# Patient Record
Sex: Female | Born: 1945 | Race: Black or African American | Hispanic: No | Smoking: Never smoker
Health system: Southern US, Community
[De-identification: ages and names within clinical notes are randomized; demographics above are authoritative.]

## PROBLEM LIST (undated history)

## (undated) DIAGNOSIS — I10 Essential (primary) hypertension: Secondary | ICD-10-CM

## (undated) DIAGNOSIS — E785 Hyperlipidemia, unspecified: Secondary | ICD-10-CM

## (undated) DIAGNOSIS — E119 Type 2 diabetes mellitus without complications: Secondary | ICD-10-CM

## (undated) DIAGNOSIS — I509 Heart failure, unspecified: Secondary | ICD-10-CM

## (undated) HISTORY — PX: APPENDECTOMY: SHX54

---

## 1982-05-02 HISTORY — PX: TUBAL LIGATION: SHX77

## 2021-03-11 ENCOUNTER — Emergency Department: Payer: Medicare Other

## 2021-03-11 ENCOUNTER — Other Ambulatory Visit: Payer: Self-pay

## 2021-03-11 ENCOUNTER — Emergency Department
Admission: EM | Admit: 2021-03-11 | Discharge: 2021-03-11 | Disposition: A | Discharge status: Home / Self Care | Payer: Medicare Other | Attending: Emergency Medicine | Admitting: Emergency Medicine

## 2021-03-11 ENCOUNTER — Encounter: Payer: Self-pay | Admitting: Emergency Medicine

## 2021-03-11 DIAGNOSIS — E785 Hyperlipidemia, unspecified: Secondary | ICD-10-CM | POA: Insufficient documentation

## 2021-03-11 DIAGNOSIS — I110 Hypertensive heart disease with heart failure: Secondary | ICD-10-CM | POA: Diagnosis not present

## 2021-03-11 DIAGNOSIS — I2510 Atherosclerotic heart disease of native coronary artery without angina pectoris: Secondary | ICD-10-CM | POA: Insufficient documentation

## 2021-03-11 DIAGNOSIS — Z20822 Contact with and (suspected) exposure to covid-19: Secondary | ICD-10-CM | POA: Diagnosis not present

## 2021-03-11 DIAGNOSIS — R0602 Shortness of breath: Secondary | ICD-10-CM | POA: Diagnosis present

## 2021-03-11 DIAGNOSIS — R062 Wheezing: Secondary | ICD-10-CM | POA: Insufficient documentation

## 2021-03-11 DIAGNOSIS — E1169 Type 2 diabetes mellitus with other specified complication: Secondary | ICD-10-CM | POA: Insufficient documentation

## 2021-03-11 DIAGNOSIS — I5030 Unspecified diastolic (congestive) heart failure: Secondary | ICD-10-CM | POA: Diagnosis not present

## 2021-03-11 HISTORY — DX: Essential (primary) hypertension: I10

## 2021-03-11 HISTORY — DX: Hyperlipidemia, unspecified: E78.5

## 2021-03-11 HISTORY — DX: Heart failure, unspecified: I50.9

## 2021-03-11 HISTORY — DX: Type 2 diabetes mellitus without complications: E11.9

## 2021-03-11 LAB — CBC
HCT: 31.9 % — ABNORMAL LOW (ref 36.0–46.0)
Hemoglobin: 10.1 g/dL — ABNORMAL LOW (ref 12.0–15.0)
MCH: 28.8 pg (ref 26.0–34.0)
MCHC: 31.7 g/dL (ref 30.0–36.0)
MCV: 90.9 fL (ref 80.0–100.0)
Platelets: 246 10*3/uL (ref 150–400)
RBC: 3.51 MIL/uL — ABNORMAL LOW (ref 3.87–5.11)
RDW: 14.8 % (ref 11.5–15.5)
WBC: 10.1 10*3/uL (ref 4.0–10.5)
nRBC: 0 % (ref 0.0–0.2)

## 2021-03-11 LAB — BASIC METABOLIC PANEL
Anion gap: 8 (ref 5–15)
BUN: 24 mg/dL — ABNORMAL HIGH (ref 8–23)
CO2: 27 mmol/L (ref 22–32)
Calcium: 9.1 mg/dL (ref 8.9–10.3)
Chloride: 104 mmol/L (ref 98–111)
Creatinine, Ser: 1.14 mg/dL — ABNORMAL HIGH (ref 0.44–1.00)
GFR, Estimated: 51 mL/min — ABNORMAL LOW (ref >60)
Glucose, Bld: 111 mg/dL — ABNORMAL HIGH (ref 70–99)
Potassium: 4.6 mmol/L (ref 3.5–5.1)
Sodium: 139 mmol/L (ref 135–145)

## 2021-03-11 LAB — BRAIN NATRIURETIC PEPTIDE: B Natriuretic Peptide: 101.5 pg/mL — ABNORMAL HIGH (ref 0.0–100.0)

## 2021-03-11 LAB — TROPONIN I (HIGH SENSITIVITY)
Troponin I (High Sensitivity): 34 ng/L — ABNORMAL HIGH (ref <18)
Troponin I (High Sensitivity): 34 ng/L — ABNORMAL HIGH (ref <18)

## 2021-03-11 MED ORDER — COMPRESSOR/NEBULIZER MISC: 1.0000 [IU] | Freq: Once | 0 refills | Status: AC

## 2021-03-11 MED ORDER — METHYLPREDNISOLONE SODIUM SUCC 125 MG IJ SOLR
125.0000 mg | Freq: Once | INTRAMUSCULAR | Status: AC
  Administered 2021-03-11: 125 mg via INTRAVENOUS
  Filled 2021-03-11: qty 2

## 2021-03-11 MED ORDER — IPRATROPIUM-ALBUTEROL 0.5-2.5 (3) MG/3ML IN SOLN
6.0000 mL | Freq: Once | RESPIRATORY_TRACT | Status: AC
  Administered 2021-03-11: 6 mL via RESPIRATORY_TRACT
  Filled 2021-03-11: qty 3

## 2021-03-11 NOTE — ED Provider Notes (Signed)
[facility] Emergency Department Provider Note ____________________________________________   Event Date/Time   First MD Initiated Contact with Patient 03/11/21 [phone]     (approximate)  I have reviewed the triage vital signs and the nursing notes.  HISTORY  Chief Complaint Shortness of Breath   HPI Marie Harrington is a 75 y.o. femalewho presents to the ED for evaluation of SOB.   Chart review indicates obese patient with history of DM, HTN, HLD, diastolic CHF. nonobstructive CAD.  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  Patient presents to the ED for evaluation of shortness of breath, minimally productive cough and orthopnea.  She reports about 12 hours of symptoms.  Reports feeling worse with laying flat, with both chest pressure and worsening dyspnea, both improved when sitting upright.  Denies chest pain right now.  Does report audible wheezes.  Reports cough of thin sputum.   Past Medical History:  Diagnosis Date   CHF (congestive heart failure) (HCC)    Diabetes mellitus without complication (HCC)    Hyperlipemia    Hypertension     There are no problems to display for this patient.   Past Surgical History:  Procedure Laterality Date   APPENDECTOMY     TUBAL LIGATION  1984    Prior to Admission medications   Not on File    Allergies Patient has no known allergies.  History reviewed. No pertinent family history.  Social History Social History   Tobacco Use   Smoking status: Never   Smokeless tobacco: Never  Substance Use Topics   Alcohol use: Never   Drug use: Never    Review of Systems  Constitutional: No fever/chills Eyes: No visual changes. ENT: No sore throat. Cardiovascular: Positive for supine chest pain. Respiratory: Positive for cough and shortness of breath. Gastrointestinal: No abdominal pain.  No nausea, no vomiting.  No diarrhea.  No constipation. Genitourinary: Negative for  dysuria. Musculoskeletal: Negative for back pain. Skin: Negative for rash. Neurological: Negative for headaches, focal weakness or numbness.  ____________________________________________   PHYSICAL EXAM:  VITAL SIGNS: Vitals:   03/11/21 0553  BP: (!) 136/99  Pulse: (!) 105  Resp: (!) 22  Temp: 97.9 F (36.6 C)  SpO2: 95%     Constitutional: Alert and oriented.  Audible wheezing is noted.  Conversational dyspnea. Eyes: Conjunctivae are normal. PERRL. EOMI. Head: Atraumatic. Nose: No congestion/rhinnorhea. Mouth/Throat: Mucous membranes are moist.  Oropharynx non-erythematous. Neck: No stridor. No cervical spine tenderness to palpation. Cardiovascular: Tachycardic rate, regular rhythm. Grossly normal heart sounds.  Good peripheral circulation. Respiratory: Tachypneic to the mid 20s.  No evidence of distress.  Diffuse expiratory wheezes and prolonged expiratory phase throughout. Gastrointestinal: Soft , nondistended, nontender to palpation. No CVA tenderness. Musculoskeletal: No lower extremity tenderness nor edema.  No joint effusions. No signs of acute trauma. Neurologic:  Normal speech and language. No gross focal neurologic deficits are appreciated.  Skin:  Skin is warm, dry and intact. No rash noted. Psychiatric: Mood and affect are normal. Speech and behavior are normal. ____________________________________________   LABS (all labs ordered are listed, but only abnormal results are displayed)  Labs Reviewed  BASIC METABOLIC PANEL  CBC  BRAIN NATRIURETIC PEPTIDE  TROPONIN I (HIGH SENSITIVITY)   ____________________________________________  12 Lead EKG  Sinus tachycardia with a rate of 100 bpm.  Normal axis and intervals.  No evidence of acute ischemia. ____________________________________________  RADIOLOGY  ED MD interpretation: 1 view CXR reviewed by me with cardiomegaly without infiltrate  or effusion.  Official radiology report(s): DG Chest Port 1  View  Result Date: 03/11/2021 CLINICAL DATA:  Shortness of breath EXAM: PORTABLE CHEST 1 VIEW COMPARISON:  06/26/2019 FINDINGS: Cardiomegaly. Hilar vascular enlargement and large main pulmonary artery contour. There is no edema, consolidation, effusion, or pneumothorax. IMPRESSION: Cardiomegaly and pulmonary artery enlargement/hypertension. No acute finding. Electronically Signed   By: Tiburcio Pea M.D.   On: 03/11/2021 06:42    ____________________________________________   PROCEDURES and INTERVENTIONS  Procedure(s) performed (including Critical Care):  .1-3 Lead EKG Interpretation Performed by: Delton Prairie, MD Authorized by: Delton Prairie, MD     Interpretation: abnormal     ECG rate:  104   ECG rate assessment: tachycardic     Rhythm: sinus tachycardia     Ectopy: none     Conduction: normal    Medications  ipratropium-albuterol (DUONEB) 0.5-2.5 (3) MG/3ML nebulizer solution 6 mL (6 mLs Nebulization Given 03/11/21 0642)  methylPREDNISolone sodium succinate (SOLU-MEDROL) 125 mg/2 mL injection 125 mg (125 mg Intravenous Given 03/11/21 0642)    ____________________________________________   MDM / ED COURSE   75 year old woman presents to the ED with evidence of CHF exacerbation and wheezing.  She has no smoking history and carries no diagnosis of asthma or COPD, but is certainly wheezing and has stigmata of both CHF exacerbations and wheezing contributing to her dyspnea.  Considering orthopnea and positional chest pain, anticipate this is largely due to volume overload.  Due to her wheezing and poor airflow on auscultation, we will provide duo nebs and steroids.  Awaiting blood work prior to initiating diuresis.  EKG is nonischemic and she has no infiltrates on CXR.  Awaiting remainder of work-up at the time of signout.  She will be signed out to oncoming provider to follow-up on the remainder of her blood work and response to interventions.  Disposition is uncertain at this time,  if she improves in the ED there is a chance of going home     ____________________________________________   FINAL CLINICAL IMPRESSION(S) / ED DIAGNOSES  Final diagnoses:  SOB (shortness of breath)  Shortness of breath  Wheezing     ED Discharge Orders     None        Dareld Mcauliffe   Note:  This document was prepared using Dragon voice recognition software and may include unintentional dictation errors.    Delton Prairie, MD 03/11/21 [phone]

## 2021-03-11 NOTE — ED Notes (Signed)
Pt assisted to the bathroom. Pt stated feeling a little short of breath after ambulating, O2 sat 88%. O2 sat went up to 97% after getting settled into bed

## 2021-03-11 NOTE — ED Triage Notes (Signed)
Pt to ED from home c/o SOB since yesterday around 1700.  Pt states hx of CHF, denies swelling.  States clear thick productive cough.  Unable to lie flat or gets SOB.  Denies pain but states getting tight in her chest.  Pt A&Ox4, speaking in shorter sentences, skin WNL.

## 2021-03-11 NOTE — ED Notes (Signed)
Bradler MD at bedside

## 2021-03-11 NOTE — ED Provider Notes (Signed)
Emergency department handoff note  Care of this patient was signed out to me at the end of the previous provider shift.  All pertinent patient information was conveyed and all questions were answered.  Patient pending repeat troponin in which her troponin remained stable at 34.  Patient breathing much more comfortably after reassessment and requesting to go home.  Patient states that she has nebulizer medication but lacks a nebulizer and I will provide her with a printed prescription for 1.  The patient has been reexamined and is ready to be discharged.  All diagnostic results have been reviewed and discussed with the patient/family.  Care plan has been outlined and the patient/family understands all current diagnoses, results, and treatment plans.  There are no new complaints, changes, or physical findings at this time.  All questions have been addressed and answered.  All medications/devices, if any, that were given while in the emergency department or any that are being prescribed have been reviewed with the patient/family.  All side effects and adverse reactions have been explained.  Patient was instructed to, and agrees to follow-up with their primary care physician as well as return to the emergency department if any new or worsening symptoms develop.   Merwyn Katos, MD 03/11/21 [phone]

## 2021-12-01 ENCOUNTER — Emergency Department
Admission: EM | Admit: 2021-12-01 | Discharge: 2021-12-01 | Disposition: A | Discharge status: Home / Self Care | Payer: Medicare Other | Attending: Emergency Medicine | Admitting: Emergency Medicine

## 2021-12-01 ENCOUNTER — Emergency Department: Payer: Medicare Other

## 2021-12-01 ENCOUNTER — Encounter: Payer: Self-pay | Admitting: Emergency Medicine

## 2021-12-01 DIAGNOSIS — I110 Hypertensive heart disease with heart failure: Secondary | ICD-10-CM | POA: Insufficient documentation

## 2021-12-01 DIAGNOSIS — N179 Acute kidney failure, unspecified: Secondary | ICD-10-CM | POA: Insufficient documentation

## 2021-12-01 DIAGNOSIS — D649 Anemia, unspecified: Secondary | ICD-10-CM | POA: Insufficient documentation

## 2021-12-01 DIAGNOSIS — E119 Type 2 diabetes mellitus without complications: Secondary | ICD-10-CM | POA: Insufficient documentation

## 2021-12-01 DIAGNOSIS — R778 Other specified abnormalities of plasma proteins: Secondary | ICD-10-CM | POA: Diagnosis not present

## 2021-12-01 DIAGNOSIS — R42 Dizziness and giddiness: Secondary | ICD-10-CM

## 2021-12-01 DIAGNOSIS — I2510 Atherosclerotic heart disease of native coronary artery without angina pectoris: Secondary | ICD-10-CM | POA: Diagnosis not present

## 2021-12-01 DIAGNOSIS — E860 Dehydration: Secondary | ICD-10-CM | POA: Diagnosis not present

## 2021-12-01 DIAGNOSIS — I5030 Unspecified diastolic (congestive) heart failure: Secondary | ICD-10-CM | POA: Insufficient documentation

## 2021-12-01 DIAGNOSIS — R791 Abnormal coagulation profile: Secondary | ICD-10-CM

## 2021-12-01 LAB — PROTIME-INR
INR: 4 — ABNORMAL HIGH (ref 0.8–1.2)
Prothrombin Time: 39 seconds — ABNORMAL HIGH (ref 11.4–15.2)

## 2021-12-01 LAB — APTT: aPTT: 56 seconds — ABNORMAL HIGH (ref 24–36)

## 2021-12-01 LAB — URINALYSIS, COMPLETE (UACMP) WITH MICROSCOPIC
Bilirubin Urine: NEGATIVE
Glucose, UA: NEGATIVE mg/dL
Hgb urine dipstick: NEGATIVE
Ketones, ur: NEGATIVE mg/dL
Nitrite: NEGATIVE
Protein, ur: NEGATIVE mg/dL
Specific Gravity, Urine: 1.012 (ref 1.005–1.030)
pH: 7 (ref 5.0–8.0)

## 2021-12-01 LAB — CBC WITH DIFFERENTIAL/PLATELET
Abs Immature Granulocytes: 0.03 10*3/uL (ref 0.00–0.07)
Basophils Absolute: 0.1 10*3/uL (ref 0.0–0.1)
Basophils Relative: 1 %
Eosinophils Absolute: 0.3 10*3/uL (ref 0.0–0.5)
Eosinophils Relative: 4 %
HCT: 27.8 % — ABNORMAL LOW (ref 36.0–46.0)
Hemoglobin: 8 g/dL — ABNORMAL LOW (ref 12.0–15.0)
Immature Granulocytes: 0 %
Lymphocytes Relative: 17 %
Lymphs Abs: 1.4 10*3/uL (ref 0.7–4.0)
MCH: 22.2 pg — ABNORMAL LOW (ref 26.0–34.0)
MCHC: 28.8 g/dL — ABNORMAL LOW (ref 30.0–36.0)
MCV: 77 fL — ABNORMAL LOW (ref 80.0–100.0)
Monocytes Absolute: 1.1 10*3/uL — ABNORMAL HIGH (ref 0.1–1.0)
Monocytes Relative: 13 %
Neutro Abs: 5.4 10*3/uL (ref 1.7–7.7)
Neutrophils Relative %: 65 %
Platelets: 338 10*3/uL (ref 150–400)
RBC: 3.61 MIL/uL — ABNORMAL LOW (ref 3.87–5.11)
RDW: 21.2 % — ABNORMAL HIGH (ref 11.5–15.5)
Smear Review: NORMAL
WBC: 8.3 10*3/uL (ref 4.0–10.5)
nRBC: 0 % (ref 0.0–0.2)

## 2021-12-01 LAB — COMPREHENSIVE METABOLIC PANEL
ALT: 11 U/L (ref 0–44)
AST: 30 U/L (ref 15–41)
Albumin: 3.6 g/dL (ref 3.5–5.0)
Alkaline Phosphatase: 62 U/L (ref 38–126)
Anion gap: 7 (ref 5–15)
BUN: 27 mg/dL — ABNORMAL HIGH (ref 8–23)
CO2: 26 mmol/L (ref 22–32)
Calcium: 9.1 mg/dL (ref 8.9–10.3)
Chloride: 109 mmol/L (ref 98–111)
Creatinine, Ser: 1.46 mg/dL — ABNORMAL HIGH (ref 0.44–1.00)
GFR, Estimated: 38 mL/min — ABNORMAL LOW (ref >60)
Glucose, Bld: 78 mg/dL (ref 70–99)
Potassium: 4.1 mmol/L (ref 3.5–5.1)
Sodium: 142 mmol/L (ref 135–145)
Total Bilirubin: 0.8 mg/dL (ref 0.3–1.2)
Total Protein: 7.5 g/dL (ref 6.5–8.1)

## 2021-12-01 LAB — TROPONIN I (HIGH SENSITIVITY)
Troponin I (High Sensitivity): 41 ng/L — ABNORMAL HIGH (ref <18)
Troponin I (High Sensitivity): 42 ng/L — ABNORMAL HIGH (ref <18)

## 2021-12-01 MED ORDER — LACTATED RINGERS IV BOLUS
500.0000 mL | Freq: Once | INTRAVENOUS | Status: AC
  Administered 2021-12-01: 500 mL via INTRAVENOUS

## 2021-12-01 MED ORDER — LACTATED RINGERS IV BOLUS
1000.0000 mL | Freq: Once | INTRAVENOUS | Status: AC
  Administered 2021-12-01: 1000 mL via INTRAVENOUS

## 2021-12-01 MED ORDER — LACTATED RINGERS IV BOLUS: 1000.0000 mL | Freq: Once | INTRAVENOUS | Status: DC

## 2021-12-01 NOTE — ED Triage Notes (Signed)
First Nurse Note:  Pt via [facility] from home. Pt stood up from table and had some dizziness. States she eased herself back down to ground. Pt has bruising to her face from a fall on Saturday but was seen and cleared for that.   Pt is A&Ox4 and NAD 140/88 BP  80 HR 15 RR? 283 CBG 99% on RA

## 2021-12-01 NOTE — ED Triage Notes (Signed)
Patient wheeled back into triage with sister and having a period of unresponsiveness. Patient was gazing to the left and not answering. Episode lasted about a min. Patient then able to answer everything appropriately. Patients sister states pt fell last Saturday and hit left side of head and was seen at hospital. Patient has bruising to left side of face and bilateral eyes. Pt on xarelto.

## 2021-12-01 NOTE — Discharge Instructions (Addendum)
As we discussed please discontinue your blood thinner (Xarelto) until he can be seen by your doctor within the next 24 to 48 hours.  It is very important that you are seen by your doctor within the next 1 to 2 days to discuss this medication given that your INR today was 4.0 indicating thin blood.  Please drink plenty of fluids return to the emergency department for any symptom personally concerning to yourself.

## 2021-12-01 NOTE — ED Provider Notes (Signed)
-----------------------------------------   4:56 PM on 12/01/2021 ----------------------------------------- Patient care assumed from Dr. Katrinka Blazing.  Urinalysis does not show any obvious urinary tract infection.  Troponin is unchanged after several hours.  Patient's blood pressure is improved now 110/60.  We will ensure the patient is able to get up and ambulate.  As long as patient is able to ambulate safely anticipate likely discharge home per prior providers care plan.  Blood pressure now up to 135 systolic.  Patient ambulating without any difficulty whatsoever.  Patient will follow-up with her doctor will discontinue Xarelto until seen by her doctor within the next 1 to 2 days given her elevated INR.  Spoke to the patient and family member regarding this and they are agreeable to this plan.   Minna Antis, MD 12/01/21 1745

## 2021-12-01 NOTE — ED Provider Notes (Signed)
[facility] Provider Note    Event Date/Time   First MD Initiated Contact with Patient 12/01/21 1308     (approximate)   History   Dizziness   HPI  Marie Harrington is a 76 y.o. 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  She states that she fell a couple days ago because she think she had a nightmare and came out of her bed.  She does not feeling dizzy at that time.  She only has a slight headache that is residual since then.  She states that today when she got up she started feeling very lightheaded.  She denies any chest pain, cough, shortness of breath, abdominal pain, vomiting or diarrhea but has had very little appetite which she attributes to her depression which she is being treated for.  She is not suicidal.  She denies any illicit drug use or EtOH use or any new medication changes.      Physical Exam  Triage Vital Signs: ED Triage Vitals  Enc Vitals Group     BP      Pulse      Resp      Temp      Temp src      SpO2      Weight      Height      Head Circumference      Peak Flow      Pain Score      Pain Loc      Pain Edu?      Excl. in GC?     Most recent vital signs: Vitals:   12/01/21 1315 12/01/21 1400  BP: (!) 99/55 (!) 95/57  Pulse: 76 69  Resp: 16   SpO2: 97% 99%    General: Awake, no distress.  CV:  Good peripheral perfusion.  2+ radial pulses. Resp:  Normal effort.  Bilaterally Abd:  No distention.  Soft throughout Other:   CN II-XII grossly intact.   Extensive bilateral ecchymosis over the face.  No other obvious trauma to the face scalp head or neck.  No pronator drift.  No finger dysmetria.  No tenderness/deformities over the  C/T/L spine  No focal TTP over b/l shoulders, elbows, wrists, hips, knees, ankles  2+ b/l radial and PD pulses   No other obvious trauma to face, scalp ,head, neck or torso    ED Results / Procedures / Treatments  Labs (all labs ordered are listed, but only abnormal results are displayed) Labs Reviewed  CBC WITH DIFFERENTIAL/PLATELET - Abnormal; Notable for the following components:      Result Value   RBC 3.61 (*)    Hemoglobin 8.0 (*)    HCT 27.8 (*)    MCV 77.0 (*)    MCH 22.2 (*)    MCHC 28.8 (*)    RDW 21.2 (*)    Monocytes Absolute 1.1 (*)    All other components within normal limits  COMPREHENSIVE METABOLIC PANEL - Abnormal; Notable for the following components:   BUN 27 (*)    Creatinine, Ser 1.46 (*)    GFR, Estimated 38 (*)    All other components within normal limits  APTT -  Abnormal; Notable for the following components:   aPTT 56 (*)    All other components within normal limits  PROTIME-INR - Abnormal; Notable for the following components:   Prothrombin Time 39.0 (*)    INR 4.0 (*)    All other components within normal limits  TROPONIN I (HIGH SENSITIVITY) - Abnormal; Notable for the following components:   Troponin I (High Sensitivity) 41 (*)    All other components within normal limits  URINALYSIS, COMPLETE (UACMP) WITH MICROSCOPIC  TYPE AND SCREEN  TROPONIN I (HIGH SENSITIVITY)     EKG  EKG is remarkable for sinus rhythm with a ventricular rate of 75, normal axis, unremarkable intervals without clear evidence of acute ischemia or significant arrhythmia.   RADIOLOGY  CT head on my interpretation without evidence of ischemia, edema, hemorrhage, mass effect or other acute intracranial process.  I also reviewed radiology interpretation.   PROCEDURES:  Critical Care performed: No  .1-3 Lead EKG Interpretation  Performed by: Gilles Chiquito, MD Authorized by: Gilles Chiquito, MD     Interpretation: normal     ECG rate assessment: normal      Rhythm: sinus rhythm     Ectopy: none     Conduction: normal     The patient is on the cardiac monitor to evaluate for evidence of arrhythmia and/or significant heart rate changes.   MEDICATIONS ORDERED IN ED: Medications  lactated ringers bolus 500 mL (has no administration in time range)  lactated ringers bolus 1,000 mL (1,000 mLs Intravenous New Bag/Given 12/01/21 1352)     IMPRESSION / MDM / ASSESSMENT AND PLAN / ED COURSE  I reviewed the triage vital signs and the nursing notes. Patient's presentation is most consistent with acute presentation with potential threat to life or bodily function.                               Differential diagnosis includes, but is not limited to orthostasis from dehydration secondary to poor p.o. intake, overdiuresis that she is on Lasix, delayed intracranial hemorrhage, arrhythmia, anemia and metabolic derangements.  She denies any acute infectious symptoms and has no other findings of infection on exam.  EKG is remarkable for sinus rhythm with a ventricular rate of 75, normal axis, unremarkable intervals without clear evidence of acute ischemia or significant arrhythmia.  CT head on my interpretation without evidence of ischemia, edema, hemorrhage, mass effect or other acute intracranial process.  I also reviewed radiology interpretation.  CMP is remarkable for evidence of AKI with creatinine of 1.46 compared to 1.4 on 3/20 with no other significant electrolyte or metabolic derangements.  CBC without leukocytosis and hemoglobin of 8 compared to 8.5 on 3/20 nonsuggestive of acute symptomatic anemia.  Platelets today are unremarkable.  Troponin slightly elevated at 41 compared to 34 8 months ago.  I suspect some mild demand in the setting of dehydration with lower suspicion for an occlusion MI.  I will plan to trend this and obtain a repeat at 2 hours.  PTT is 56.  INR is 4.  I will plan to hydrate and follow-up repeat troponin.  We will also obtain a  UA to assess for possible cystitis.  Care patient signed over to assuming provider at approximately 1500 with plan to follow-up repeat troponin and urine studies.  In addition plan to obtain orthostatics and reassess patient after she has received some IV fluids.  If she is normotensive  and feeling better and not orthostatic with downtrending troponin she can likely be safely discharged with outpatient follow-up.      FINAL CLINICAL IMPRESSION(S) / ED DIAGNOSES   Final diagnoses:  Dizziness  Dehydration  Troponin I above reference range  Elevated INR  Chronic anemia     Rx / DC Orders   ED Discharge Orders     None        Note:  This document was prepared using Dragon voice recognition software and may include unintentional dictation errors.   Gilles Chiquito, MD 12/01/21 [phone]

## 2021-12-01 NOTE — ED Notes (Signed)
Pt A&O, IV removed, pt given discharge instructions, pt assisted by friend to vehicle.

## 2022-11-06 IMAGING — DX DG CHEST 1V PORT
1 series · 1 of 1 positions shown · non-contrast
Comparison: 06/26/2019

CLINICAL DATA: Shortness of breath

EXAM:
PORTABLE CHEST 1 VIEW

[chest ap]
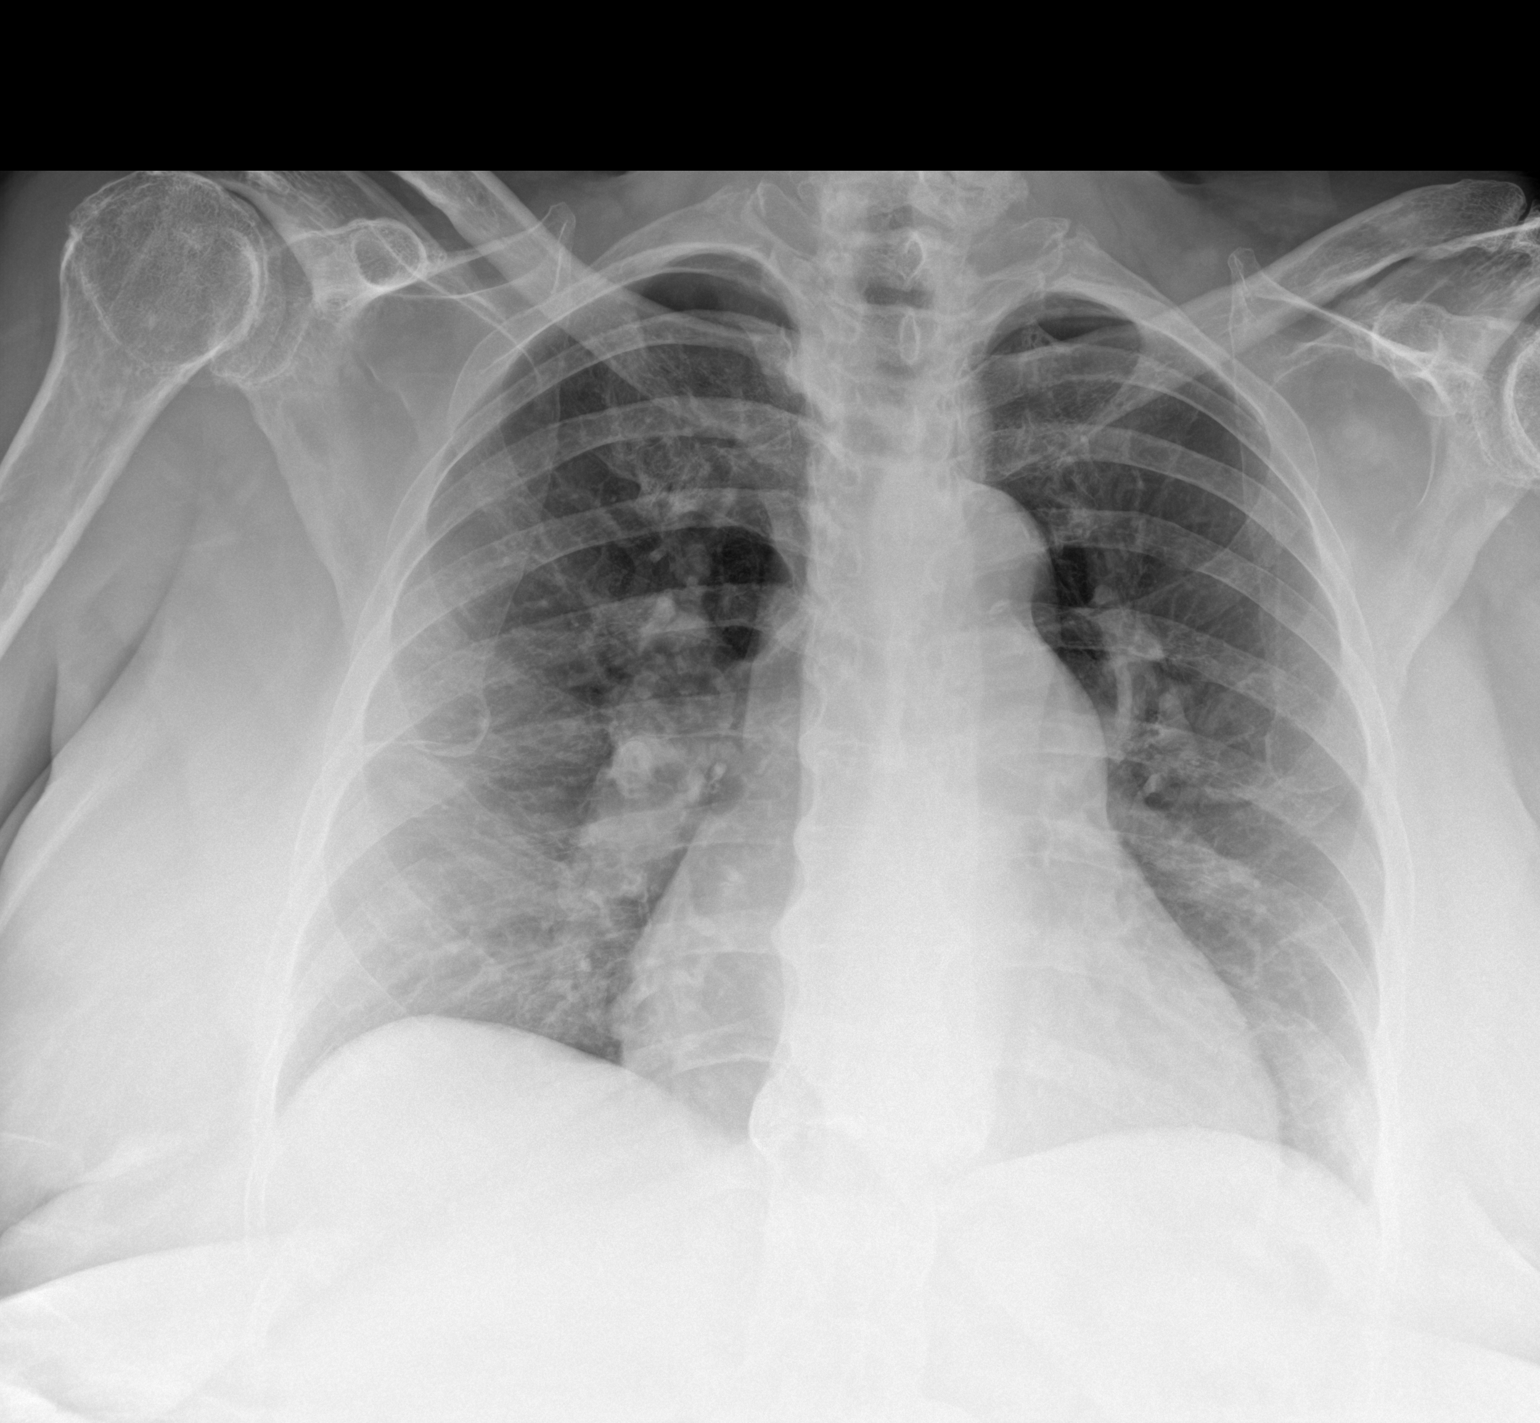

[1 of 1 positions shown; findings below may reference images not displayed]

FINDINGS: Cardiomegaly. Hilar vascular enlargement and large main pulmonary
artery contour. There is no edema, consolidation, effusion, or
pneumothorax.
IMPRESSION: Cardiomegaly and pulmonary artery enlargement/hypertension. No acute
finding.
